# Patient Record
Sex: Female | Born: 2005 | Hispanic: No | Marital: Single
Health system: Southern US, Community
[De-identification: ages and names within clinical notes are randomized; demographics above are authoritative.]

---
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
## 2024-07-13 ENCOUNTER — Telehealth (HOSPITAL_COMMUNITY): Payer: Self-pay | Admitting: Emergency Medicine
